# Patient Record
Sex: Male | Born: 1965 | Race: White | Hispanic: No | Marital: Married | Smoking: Never smoker
Health system: Southern US, Community
[De-identification: ages and names within clinical notes are randomized; demographics above are authoritative.]

## PROBLEM LIST (undated history)

## (undated) DIAGNOSIS — J40 Bronchitis, not specified as acute or chronic: Secondary | ICD-10-CM

## (undated) DIAGNOSIS — N200 Calculus of kidney: Secondary | ICD-10-CM

---

## 2012-04-18 ENCOUNTER — Emergency Department (HOSPITAL_COMMUNITY)
Admission: EM | Admit: 2012-04-18 | Discharge: 2012-04-19 | Disposition: A | Discharge status: Home / Self Care | Payer: 59 | Attending: Emergency Medicine | Admitting: Emergency Medicine

## 2012-04-18 ENCOUNTER — Encounter (HOSPITAL_COMMUNITY): Payer: Self-pay | Admitting: *Deleted

## 2012-04-18 DIAGNOSIS — Z79899 Other long term (current) drug therapy: Secondary | ICD-10-CM | POA: Insufficient documentation

## 2012-04-18 DIAGNOSIS — R110 Nausea: Secondary | ICD-10-CM | POA: Insufficient documentation

## 2012-04-18 DIAGNOSIS — N201 Calculus of ureter: Secondary | ICD-10-CM | POA: Insufficient documentation

## 2012-04-18 NOTE — ED Notes (Signed)
The pt has had lt flank pain since yesterday.  No nv no previous history

## 2012-04-19 ENCOUNTER — Emergency Department (HOSPITAL_COMMUNITY): Payer: 59

## 2012-04-19 LAB — CBC WITH DIFFERENTIAL/PLATELET
Basophils Absolute: 0 10*3/uL (ref 0.0–0.1)
Eosinophils Absolute: 0.4 10*3/uL (ref 0.0–0.7)
Eosinophils Relative: 4 % (ref 0–5)
Lymphs Abs: 3.9 10*3/uL (ref 0.7–4.0)
MCH: 30.3 pg (ref 26.0–34.0)
Neutrophils Relative %: 54 % (ref 43–77)
Platelets: 210 10*3/uL (ref 150–400)
RBC: 5.34 MIL/uL (ref 4.22–5.81)
RDW: 12.6 % (ref 11.5–15.5)
WBC: 11.3 10*3/uL — ABNORMAL HIGH (ref 4.0–10.5)

## 2012-04-19 LAB — COMPREHENSIVE METABOLIC PANEL
ALT: 21 U/L (ref 0–53)
AST: 20 U/L (ref 0–37)
Albumin: 4.1 g/dL (ref 3.5–5.2)
Alkaline Phosphatase: 86 U/L (ref 39–117)
Calcium: 9.6 mg/dL (ref 8.4–10.5)
GFR calc Af Amer: >90 mL/min (ref >90)
Glucose, Bld: 111 mg/dL — ABNORMAL HIGH (ref 70–99)
Potassium: 3.7 mEq/L (ref 3.5–5.1)
Sodium: 141 mEq/L (ref 135–145)
Total Protein: 7.5 g/dL (ref 6.0–8.3)

## 2012-04-19 LAB — URINALYSIS, ROUTINE W REFLEX MICROSCOPIC
Bilirubin Urine: NEGATIVE
Nitrite: NEGATIVE
Protein, ur: NEGATIVE mg/dL
Specific Gravity, Urine: 1.029 (ref 1.005–1.030)
Urobilinogen, UA: 0.2 mg/dL (ref 0.0–1.0)

## 2012-04-19 LAB — URINE MICROSCOPIC-ADD ON

## 2012-04-19 MED ORDER — PROMETHAZINE HCL 25 MG PO TABS: 25.0000 mg | ORAL_TABLET | Freq: Four times a day (QID) | ORAL | Status: DC | PRN

## 2012-04-19 MED ORDER — HYDROMORPHONE HCL PF 1 MG/ML IJ SOLN
1.0000 mg | Freq: Once | INTRAMUSCULAR | Status: AC
  Administered 2012-04-19: 1 mg via INTRAVENOUS
  Filled 2012-04-19: qty 1

## 2012-04-19 MED ORDER — HYDROCODONE-ACETAMINOPHEN 5-500 MG PO TABS: 1.0000 | ORAL_TABLET | Freq: Four times a day (QID) | ORAL | Status: DC | PRN

## 2012-04-19 MED ORDER — SODIUM CHLORIDE 0.9 % IV SOLN
Freq: Once | INTRAVENOUS | Status: AC
  Administered 2012-04-19: 02:00:00 via INTRAVENOUS

## 2012-04-19 MED ORDER — KETOROLAC TROMETHAMINE 30 MG/ML IJ SOLN
30.0000 mg | Freq: Once | INTRAMUSCULAR | Status: AC
  Administered 2012-04-19: 30 mg via INTRAVENOUS
  Filled 2012-04-19: qty 1

## 2012-04-19 MED ORDER — ONDANSETRON HCL 4 MG/2ML IJ SOLN
4.0000 mg | Freq: Once | INTRAMUSCULAR | Status: AC
  Administered 2012-04-19: 4 mg via INTRAVENOUS
  Filled 2012-04-19: qty 2

## 2012-04-19 MED ORDER — OXYCODONE-ACETAMINOPHEN 5-325 MG PO TABS
2.0000 | ORAL_TABLET | Freq: Once | ORAL | Status: DC
  Filled 2012-04-19: qty 2

## 2012-04-19 MED ORDER — NAPROXEN 500 MG PO TABS: 500.0000 mg | ORAL_TABLET | Freq: Two times a day (BID) | ORAL | Status: AC

## 2012-04-19 MED ORDER — ONDANSETRON 4 MG PO TBDP: 4.0000 mg | ORAL_TABLET | Freq: Three times a day (TID) | ORAL | Status: DC | PRN

## 2012-04-19 MED ORDER — TAMSULOSIN HCL 0.4 MG PO CAPS: 0.4000 mg | ORAL_CAPSULE | Freq: Two times a day (BID) | ORAL | Status: DC

## 2012-04-19 MED ORDER — PROMETHAZINE HCL 25 MG/ML IJ SOLN
25.0000 mg | Freq: Once | INTRAMUSCULAR | Status: AC
  Administered 2012-04-19: 25 mg via INTRAVENOUS
  Filled 2012-04-19: qty 1

## 2012-04-19 NOTE — ED Notes (Signed)
Pt and family at bedside report intermittent left flank pain that began on Friday - pt states tonight the pain awoke him from his sleep and has become unbearable - pt grimacing and pacing - unable to lay on the stretcher. Pt denies any dysuria, hematuria, or fever.

## 2012-04-19 NOTE — ED Provider Notes (Signed)
History     CSN: [id number]  Arrival date & time 04/18/12  2352   First MD Initiated Contact with Patient 04/19/12 0000      Chief Complaint  Patient presents with  . Flank Pain    (Consider location/radiation/quality/duration/timing/severity/associated sxs/prior treatment) HPI Comments: 46 year old male with no past medical history, no abdominal surgery. He presents with a complaint of left flank pain radiating to the left groin which started on Friday. The onset was acute, the course has been intermittent, he has discrete flares of the pain which feels like he is being hit in the flank. He has associated nausea when the pain hits. It became much more severe this evening. He denies any dysuria, he does admit to having more concentrated and dark appearing urine but attributes this to less by mouth fluid intake over the last 24 hours.  Patient is a 46 y.o. male presenting with flank pain. The history is provided by the patient and the spouse.  Flank Pain Associated symptoms include abdominal pain.    History reviewed. No pertinent past medical history.  History reviewed. No pertinent past surgical history.  No family history on file.  History  Substance Use Topics  . Smoking status: Never Smoker   . Smokeless tobacco: Not on file  . Alcohol Use: Yes      Review of Systems  Constitutional: Negative for fever and chills.  Gastrointestinal: Positive for nausea and abdominal pain. Negative for vomiting.  Genitourinary: Positive for flank pain. Negative for dysuria.    Allergies  Review of patient's allergies indicates no known allergies.  Home Medications   Current Outpatient Rx  Name  Route  Sig  Dispense  Refill  . HYDROCODONE-ACETAMINOPHEN 5-500 MG PO TABS   Oral   Take 1-2 tablets by mouth every 6 (six) hours as needed for pain.   15 tablet   0   . NAPROXEN 500 MG PO TABS   Oral   Take 1 tablet (500 mg total) by mouth 2 (two) times daily with a meal.   30  tablet   0   . ONDANSETRON 4 MG PO TBDP   Oral   Take 1 tablet (4 mg total) by mouth every 8 (eight) hours as needed for nausea.   10 tablet   0   . PROMETHAZINE HCL 25 MG PO TABS   Oral   Take 1 tablet (25 mg total) by mouth every 6 (six) hours as needed for nausea.   12 tablet   0   . TAMSULOSIN HCL 0.4 MG PO CAPS   Oral   Take 1 capsule (0.4 mg total) by mouth 2 (two) times daily.   10 capsule   0     BP 168/104  Pulse 91  Resp 20  SpO2 98%  Physical Exam  Nursing note and vitals reviewed. Constitutional:       Uncomfortable appearing  HENT:       Normocephalic, atraumatic  Eyes:       No scleral icterus, conjunctiva are clear, pupils are equal  Cardiovascular:       Regular rate and rhythm, no tachycardia, no murmurs  Pulmonary/Chest:       Lungs clear, no increased work of breathing, no wheezes rales or rhonchi  Abdominal:       Soft and nontender abdomen, CVA tenderness present on the left  Musculoskeletal:       No peripheral edema  Neurological:       Alert, follows  commands, gait is normal, speech is clear  Skin:       Warm and dry skin, no rash    ED Course  Procedures (including critical care time)  Labs Reviewed  URINALYSIS, ROUTINE W REFLEX MICROSCOPIC - Abnormal; Notable for the following:    APPearance CLOUDY (*)     Hgb urine dipstick LARGE (*)     All other components within normal limits  CBC WITH DIFFERENTIAL - Abnormal; Notable for the following:    WBC 11.3 (*)     All other components within normal limits  COMPREHENSIVE METABOLIC PANEL - Abnormal; Notable for the following:    Glucose, Bld 111 (*)     All other components within normal limits  URINE MICROSCOPIC-ADD ON - Abnormal; Notable for the following:    Bacteria, UA FEW (*)     All other components within normal limits   Ct Abdomen Pelvis Wo Contrast  04/19/2012  *RADIOLOGY REPORT*  Clinical Data: Severe left flank pain with nausea since yesterday.  CT ABDOMEN AND PELVIS  WITHOUT CONTRAST  Technique:  Multidetector CT imaging of the abdomen and pelvis was performed following the standard protocol without intravenous contrast.  Comparison: None.  Findings: Approximate 3 mm calculus at the left UVJ with marked thickening of the wall of the distal ureter surrounding the calculus.  This causes mild left hydronephrosis, moderate left renal edema, and mild left perinephric edema.  No urinary tract calculi elsewhere on either side.  No right urinary tract obstruction.  Within the limits of the unenhanced technique, no focal parenchymal abnormality involving either kidney.  Sub-centimeter simple cysts in the anterior and posterior segments of the right lobe of liver; within the limits of the unenhanced technique, with significant abnormality involving the liver. Normal unenhanced appearance of the spleen, pancreas, and adrenal glands. Gallbladder unremarkable by CT.  No biliary ductal dilation.  No visible aorto-iliofemoral atherosclerosis.  No significant lymphadenopathy.  Stomach filled with food and normal in appearance.  Normal- appearing small bowel and colon.  Normal appendix in the right mid and upper pelvis.  No ascites.  Urinary bladder decompressed and unremarkable.  Normal prostate gland and seminal vesicles for age.  Bone window images unremarkable.  Visualized lung bases clear.  IMPRESSION:  1.  Obstructing approximate 3 mm calculus at the left UVJ. 2.  No acute or significant abnormalities otherwise.   Original Report Authenticated By: Hulan Saas, M.D.      1. Ureterolithiasis       MDM  Exam consistent with a kidney stone, patient also has hypertension, will recheck after pain medications given, CT scan pending, check renal function, urinalysis to rule out infection.  Patient given Dilaudid, Toradol and Percocet prior to discharge with improvement  CT scan reviewed and shows a obstructing distal 3 mm ureteral stone at the ureterovesical junction. The patient  has had improvement with his medications, blood pressure recheck shows 145/95.  1:30 AM, patient having persistent pain, it has improved but now is having some nausea and vomiting as well. Will start second fluid bolus, recurrent repeat dose of Dilaudid.  Patient has done well, amenable to discharge  Discharge Prescriptions include:  Naprosyn Hydrocodone Flomax Phenergan Zofran     Vida Roller, MD 04/19/12 [phone]

## 2012-04-19 NOTE — ED Notes (Signed)
Patient transported to CT 

## 2012-04-19 NOTE — ED Notes (Signed)
Rx given x5 D/c instructions reviewed w/ pt and family - pt and family deny any further questions or concerns at present.

## 2012-07-24 ENCOUNTER — Emergency Department (HOSPITAL_COMMUNITY): Payer: 59

## 2012-07-24 ENCOUNTER — Observation Stay (HOSPITAL_COMMUNITY)
Admission: EM | Admit: 2012-07-24 | Discharge: 2012-07-25 | Disposition: A | Discharge status: Home / Self Care | Payer: 59 | Attending: Emergency Medicine | Admitting: Emergency Medicine

## 2012-07-24 ENCOUNTER — Other Ambulatory Visit: Payer: Self-pay

## 2012-07-24 ENCOUNTER — Encounter (HOSPITAL_COMMUNITY): Payer: Self-pay | Admitting: Emergency Medicine

## 2012-07-24 DIAGNOSIS — Z792 Long term (current) use of antibiotics: Secondary | ICD-10-CM | POA: Insufficient documentation

## 2012-07-24 DIAGNOSIS — R0602 Shortness of breath: Secondary | ICD-10-CM | POA: Insufficient documentation

## 2012-07-24 DIAGNOSIS — R079 Chest pain, unspecified: Principal | ICD-10-CM | POA: Insufficient documentation

## 2012-07-24 DIAGNOSIS — J40 Bronchitis, not specified as acute or chronic: Secondary | ICD-10-CM | POA: Insufficient documentation

## 2012-07-24 DIAGNOSIS — M79609 Pain in unspecified limb: Secondary | ICD-10-CM | POA: Insufficient documentation

## 2012-07-24 HISTORY — DX: Calculus of kidney: N20.0

## 2012-07-24 HISTORY — DX: Bronchitis, not specified as acute or chronic: J40

## 2012-07-24 LAB — CBC WITH DIFFERENTIAL/PLATELET
Eosinophils Absolute: 0.3 10*3/uL (ref 0.0–0.7)
Hemoglobin: 16.1 g/dL (ref 13.0–17.0)
Lymphocytes Relative: 38 % (ref 12–46)
Lymphs Abs: 2.8 10*3/uL (ref 0.7–4.0)
MCH: 31 pg (ref 26.0–34.0)
Monocytes Relative: 8 % (ref 3–12)
Neutro Abs: 3.8 10*3/uL (ref 1.7–7.7)
Neutrophils Relative %: 51 % (ref 43–77)
Platelets: 210 10*3/uL (ref 150–400)
RBC: 5.2 MIL/uL (ref 4.22–5.81)
WBC: 7.4 10*3/uL (ref 4.0–10.5)

## 2012-07-24 LAB — BASIC METABOLIC PANEL
BUN: 18 mg/dL (ref 6–23)
Chloride: 104 mEq/L (ref 96–112)
GFR calc Af Amer: >90 mL/min (ref >90)
GFR calc non Af Amer: >90 mL/min (ref >90)
Glucose, Bld: 93 mg/dL (ref 70–99)
Potassium: 3.8 mEq/L (ref 3.5–5.1)
Sodium: 141 mEq/L (ref 135–145)

## 2012-07-24 LAB — POCT I-STAT TROPONIN I

## 2012-07-24 MED ORDER — ASPIRIN 81 MG PO CHEW
324.0000 mg | CHEWABLE_TABLET | Freq: Once | ORAL | Status: AC
  Administered 2012-07-24: 324 mg via ORAL

## 2012-07-24 MED ORDER — ASPIRIN 81 MG PO CHEW
CHEWABLE_TABLET | ORAL | Status: AC
  Administered 2012-07-24: 324 mg via ORAL
  Filled 2012-07-24: qty 4

## 2012-07-24 NOTE — ED Notes (Signed)
Pt c/o midsternal cp radiating to left arm and left side of neck x 5 days.

## 2012-07-24 NOTE — ED Provider Notes (Signed)
History     CSN: [id number]  Arrival date & time 07/24/12  1516   None     Chief Complaint  Patient presents with  . Chest Pain    (Consider location/radiation/quality/duration/timing/severity/associated sxs/prior treatment) HPI History provided by pt.   Pt has had constant discomfort at LSB between ~9 or 10am until the end of his work day, for the past 5 days.  Resolves w/ sleep.  Associated w/ intermittent SOB as well as sharp pain on inner aspect of left upper arm.  Denies recent fever/cough, but his PCP started him on an antibiotic for bronchitis yesterday.  He also prescribed aleve for treatment of possible costochondritis.  Advised him to go to ED for persistent sx.  Today, he had what he believed to be an adrenaline rush while playing a video game, the pain returned, was worse than prior episodes, and was associated w/ SOB and LUE weakness.  Currently asymptomatic.  Denies trauma.  No RF for PE and denies LE pain/edema.  Does not smoke cigarettes and no FH early MI.  Past Medical History  Diagnosis Date  . Kidney stone     History reviewed. No pertinent past surgical history.  No family history on file.  History  Substance Use Topics  . Smoking status: Never Smoker   . Smokeless tobacco: Not on file  . Alcohol Use: No      Review of Systems  All other systems reviewed and are negative.    Allergies  Review of patient's allergies indicates no known allergies.  Home Medications   Current Outpatient Rx  Name  Route  Sig  Dispense  Refill  . levofloxacin (LEVAQUIN) 750 MG tablet   Oral   Take 750 mg by mouth every evening. Take for 7 days. Started 07/23/12         . Multiple Vitamin (MULTIVITAMIN WITH MINERALS) TABS   Oral   Take 1 tablet by mouth daily.         . naproxen (NAPROSYN) 500 MG tablet   Oral   Take 1 tablet (500 mg total) by mouth 2 (two) times daily with a meal.   30 tablet   0     BP 113/76  Pulse 65  Temp(Src) 97.7 F (36.5 C)  (Oral)  Resp 13  SpO2 97%  Physical Exam  Nursing note and vitals reviewed. Constitutional: He is oriented to person, place, and time. He appears well-developed and well-nourished. No distress.  HENT:  Head: Normocephalic and atraumatic.  Eyes:  Normal appearance  Neck: Normal range of motion.  Cardiovascular: Normal rate, regular rhythm and intact distal pulses.   Pulmonary/Chest: Effort normal and breath sounds normal. No respiratory distress. He exhibits no tenderness.  No pleuritic pain reported  Abdominal: Soft. Bowel sounds are normal. He exhibits no distension. There is no tenderness. There is no guarding.  Musculoskeletal: Normal range of motion.  No peripheral edema or calf tenderness  Neurological: He is alert and oriented to person, place, and time.  Skin: Skin is warm and dry. No rash noted.  Psychiatric: He has a normal mood and affect. His behavior is normal.    ED Course  Procedures (including critical care time)   Date: 07/24/2012  Rate: 70  Rhythm: normal sinus rhythm  QRS Axis: right  Intervals: normal  ST/T Wave abnormalities: normal  Conduction Disutrbances:incomplete RBBB  Narrative Interpretation:   Old EKG Reviewed: unchanged   Labs Reviewed  CBC WITH DIFFERENTIAL  BASIC METABOLIC PANEL  POCT I-STAT TROPONIN I  POCT I-STAT TROPONIN I   Dg Chest 2 View  07/24/2012  *RADIOLOGY REPORT*  Clinical Data: Chest pain.  CHEST - 2 VIEW  Comparison: None.  Findings: Cardiomediastinal silhouette appears normal.  No acute pulmonary disease is noted.  Bony thorax is intact.  IMPRESSION: No acute cardiopulmonary abnormality seen.   Original Report Authenticated By: Lupita Raider.,  M.D.      No diagnosis found.    MDM  47yo healthy M presents w/ CP.  Low risk ACS and both typical and atypical features of pain.  EKG non-ischemic, troponin neg, labs otherwise unremarkable and CXR neg.  Pt to be placed on CP protocol.    VSS.  Dr. Dierdre Highman aware of patient.   6:38 AM         Otilio Miu, PA-C 07/25/12 [phone]

## 2012-07-25 ENCOUNTER — Observation Stay (HOSPITAL_COMMUNITY): Payer: 59

## 2012-07-25 ENCOUNTER — Encounter (HOSPITAL_COMMUNITY): Payer: Self-pay | Admitting: *Deleted

## 2012-07-25 LAB — POCT I-STAT TROPONIN I: Troponin i, poc: 0 ng/mL (ref 0.00–0.08)

## 2012-07-25 MED ORDER — IBUPROFEN 600 MG PO TABS: 600.0000 mg | ORAL_TABLET | Freq: Four times a day (QID) | ORAL | Status: AC | PRN

## 2012-07-25 MED ORDER — METOPROLOL TARTRATE 25 MG PO TABS
50.0000 mg | ORAL_TABLET | Freq: Once | ORAL | Status: AC
Start: 2012-07-25 — End: 2012-07-25
  Administered 2012-07-25: 50 mg via ORAL
  Filled 2012-07-25: qty 2

## 2012-07-25 MED ORDER — METOPROLOL TARTRATE 1 MG/ML IV SOLN
5.0000 mg | Freq: Once | INTRAVENOUS | Status: AC
  Administered 2012-07-25: 5 mg via INTRAVENOUS
  Filled 2012-07-25: qty 10

## 2012-07-25 MED ORDER — IOHEXOL 350 MG/ML SOLN
80.0000 mL | Freq: Once | INTRAVENOUS | Status: AC | PRN
  Administered 2012-07-25: 75 mL via INTRAVENOUS

## 2012-07-25 MED ORDER — NITROGLYCERIN 0.4 MG SL SUBL
0.4000 mg | SUBLINGUAL_TABLET | Freq: Once | SUBLINGUAL | Status: AC
  Administered 2012-07-25: 0.4 mg via SUBLINGUAL
  Filled 2012-07-25: qty 25

## 2012-07-25 MED ORDER — METOPROLOL TARTRATE 25 MG PO TABS
50.0000 mg | ORAL_TABLET | Freq: Once | ORAL | Status: AC
  Administered 2012-07-25: 50 mg via ORAL
  Filled 2012-07-25: qty 2

## 2012-07-25 NOTE — ED Provider Notes (Signed)
Medical screening examination/treatment/procedure(s) were conducted as a shared visit with non-physician practitioner(s) and myself.  I personally evaluated the patient during the encounter  History: patient presents do to 5-7 days of atypical chest pain that seems to be worse during the day while he is at work with intermittent shortness of breath and pain that radiates to the left arm. He states that it occurs whether he gets excited from something good or bad. Currently he is not experiencing any chest pain. He has never had chest pain in the past. Patient saw his PCP yesterday and was placed on Levaquin and NSAIDs for possible bronchitis. However with a worsening episode today his Dr. Ivan Anchors that he go to the emergency room for further evaluation.  Physical exam: Gen.: No acute distress HEENT: Pupils are equal round reactive, mouth is moist throat is clear Cardiovascular: Regular rate and rhythm, no murmurs rubs and gallops. Mild chest wall tenderness to the left side of the sternum Respiratory: Clear to auscultation bilaterally. No wheezes rales or rhonchi Abdomen: No tenderness to palpation Extremities: No edema and 2+ pulses bilaterally  Plan: Patient who is young and it TIMI 1 for recurrent episodes with a history of atypical chest pain. Given the worsening progressive nature of his symptoms we'll place on the chest pain protocol for a CTA in the morning. Patient is currently chest pain-free. He was given an aspirin  Gwyneth Sprout, MD 07/25/12 [phone]

## 2012-07-26 NOTE — Progress Notes (Signed)
UR completed 

## 2014-06-19 IMAGING — CT CT ABD-PELV W/O CM
2 of 4 series · 17 of 46 positions shown, 19 images · non-contrast
Comparison: None.

CLINICAL DATA: Severe left flank pain with nausea since yesterday.

CT ABDOMEN AND PELVIS WITHOUT CONTRAST
TECHNIQUE: Multidetector CT imaging of the abdomen and pelvis was
performed following the standard protocol without intravenous
contrast.

[Series 2: stone >200 lbs 5.0 b31f st · axial · 0.76mm/px · z∈[-498,-72]mm · 14 of 93 slices shown, 16 images]
[im 4/93  soft-tissue]
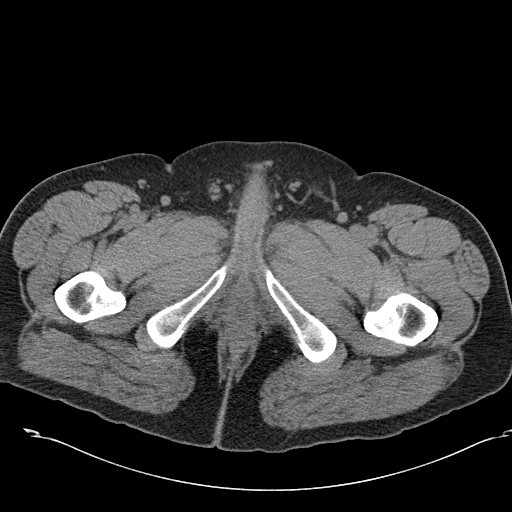
[im 4/93  bone]
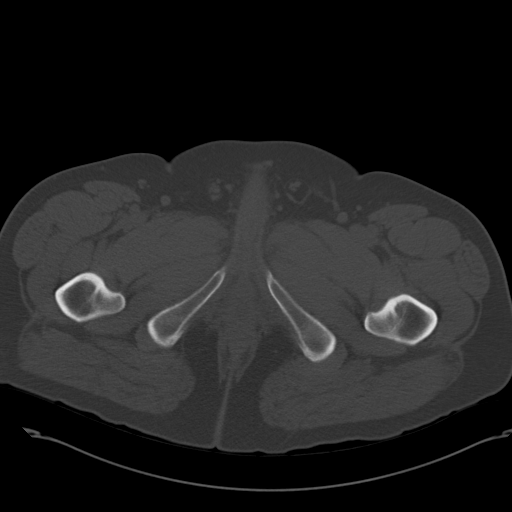
[im 11/93  soft-tissue]
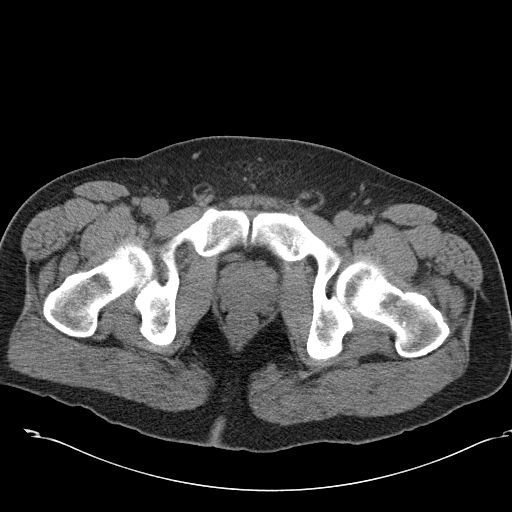
[im 18/93  soft-tissue]
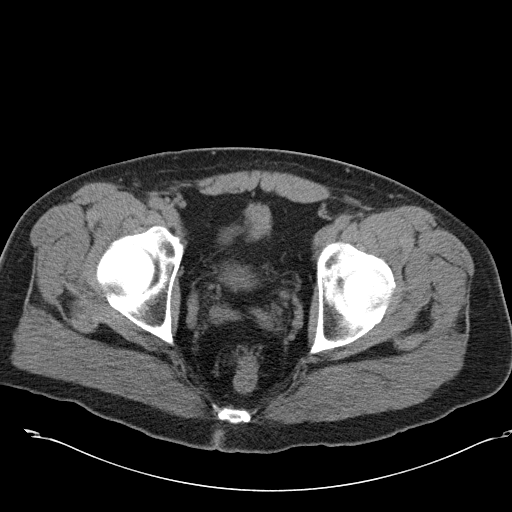
[im 25/93  soft-tissue]
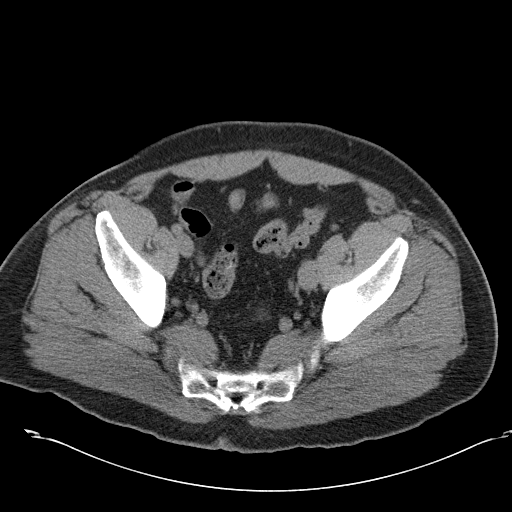
[im 32/93  soft-tissue]
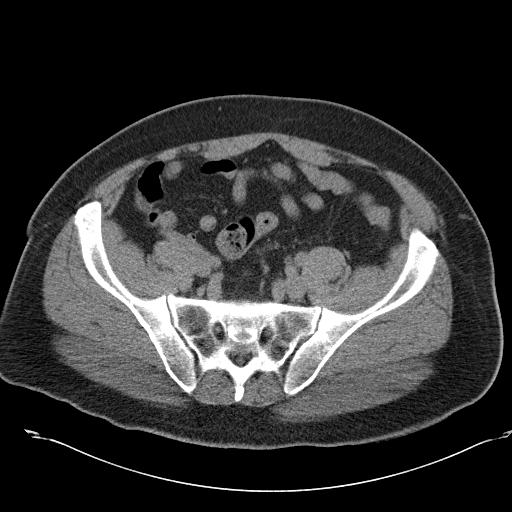
[im 36/93  soft-tissue]
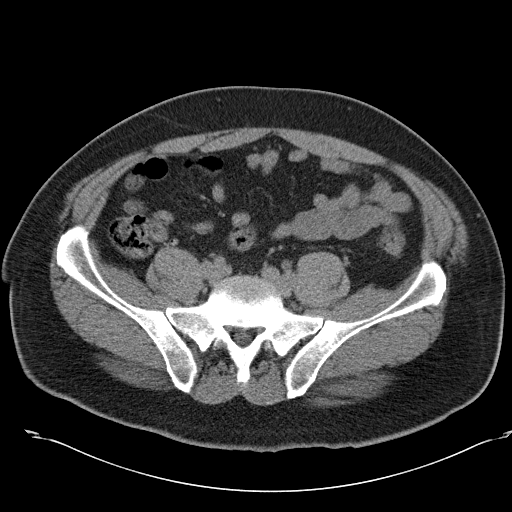
[im 43/93  soft-tissue]
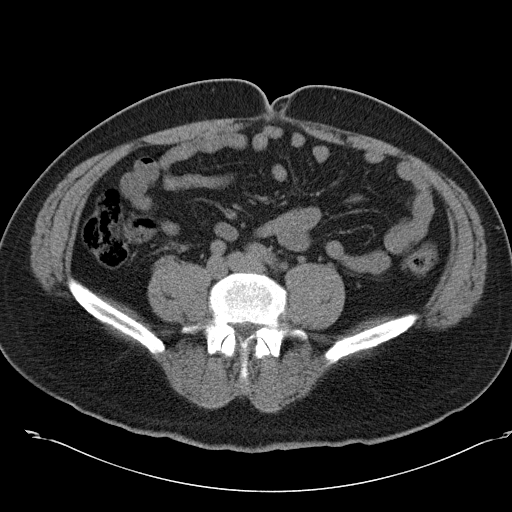
[im 50/93  soft-tissue]
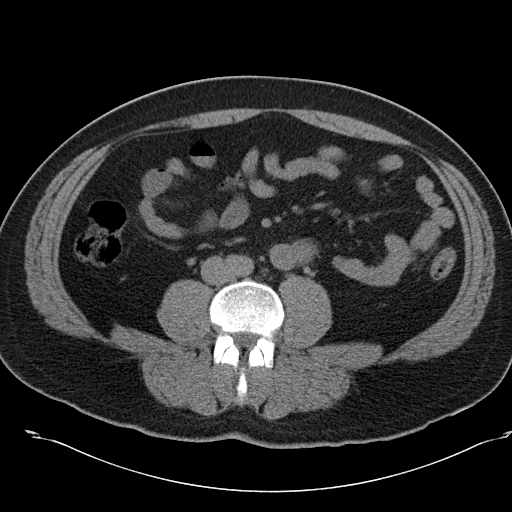
[im 57/93  soft-tissue]
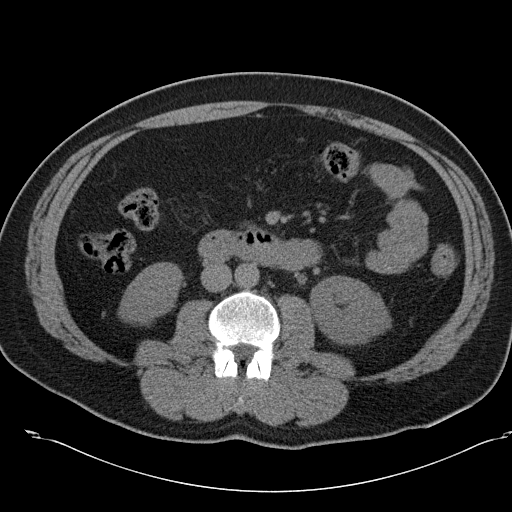
[im 57/93  bone]
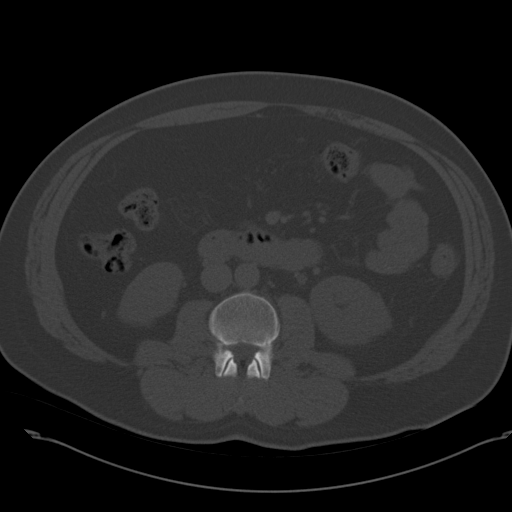
[im 61/93  soft-tissue]
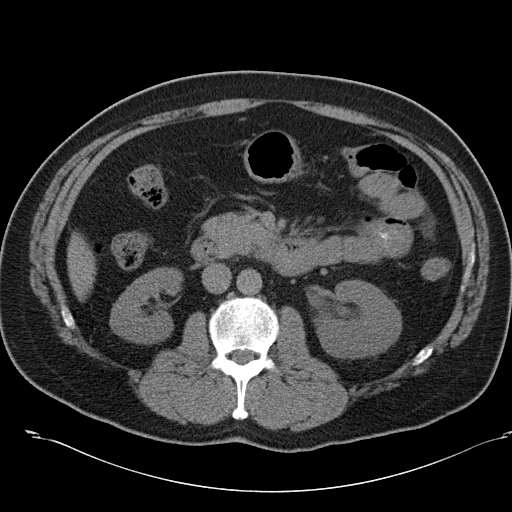
[im 68/93  soft-tissue]
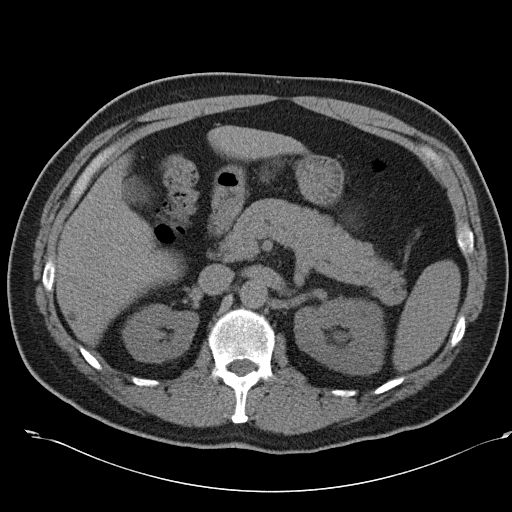
[im 75/93  soft-tissue]
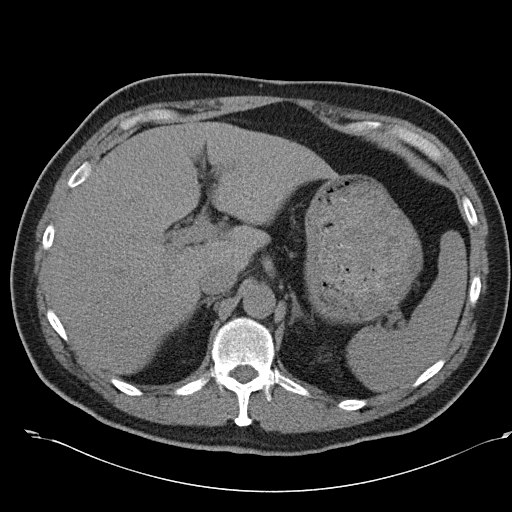
[im 82/93  soft-tissue]
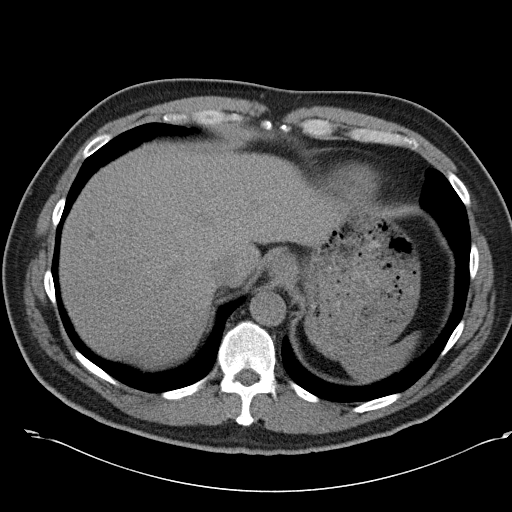
[im 89/93  soft-tissue]
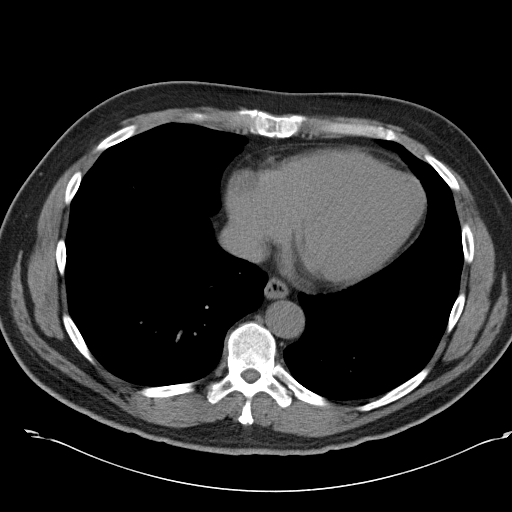

[Series 602: cor · coronal · 0.89mm/px · 3 of 82 slices shown]
[im 28/82  soft-tissue]
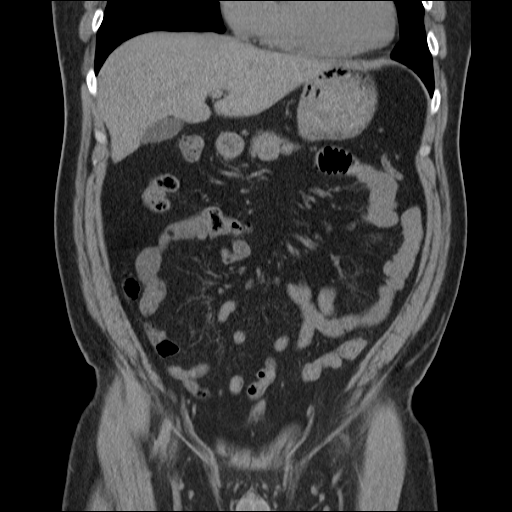
[im 37/82  soft-tissue]
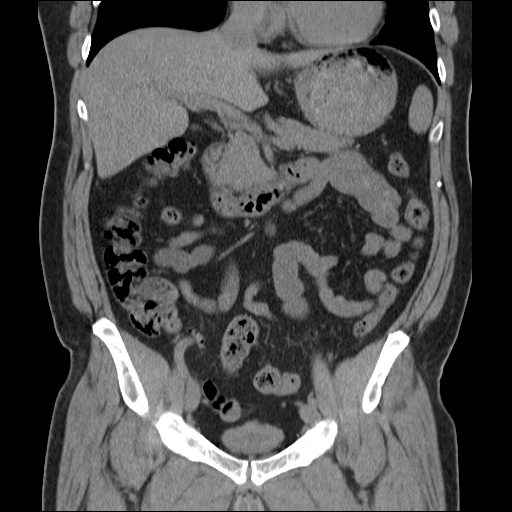
[im 46/82  soft-tissue]
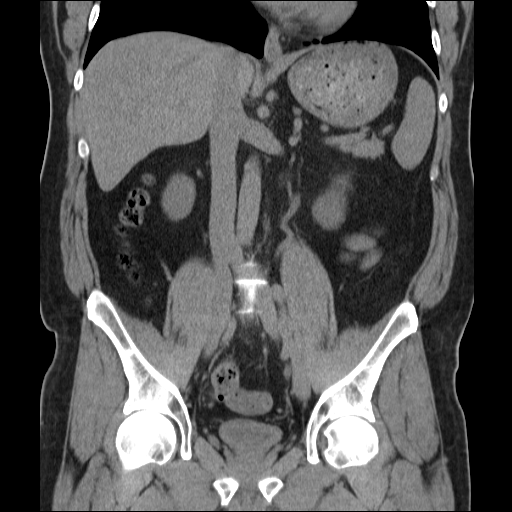

[17 of 46 positions shown; findings below may reference images not displayed]

FINDINGS: Approximate 3 mm calculus at the left UVJ with marked
thickening of the wall of the distal ureter surrounding the
calculus.  This causes mild left hydronephrosis, moderate left
renal edema, and mild left perinephric edema.  No urinary tract
calculi elsewhere on either side.  No right urinary tract
obstruction.  Within the limits of the unenhanced technique, no
focal parenchymal abnormality involving either kidney.

Sub-centimeter simple cysts in the anterior and posterior segments
of the right lobe of liver; within the limits of the unenhanced
technique, with significant abnormality involving the liver. Normal
unenhanced appearance of the spleen, pancreas, and adrenal glands.
Gallbladder unremarkable by CT.  No biliary ductal dilation.  No
visible aorto-iliofemoral atherosclerosis.  No significant
lymphadenopathy.

Stomach filled with food and normal in appearance.  Normal-
appearing small bowel and colon.  Normal appendix in the right mid
and upper pelvis.  No ascites.

Urinary bladder decompressed and unremarkable.  Normal prostate
gland and seminal vesicles for age.

Bone window images unremarkable.  Visualized lung bases clear.
IMPRESSION: 1.  Obstructing approximate 3 mm calculus at the left UVJ.
2.  No acute or significant abnormalities otherwise.

## 2018-03-22 ENCOUNTER — Ambulatory Visit: Payer: Self-pay | Admitting: Surgery

## 2018-03-22 NOTE — H&P (Signed)
History of Present Illness Andrew Krause. Marcellous Snarski MD; 03/22/2018 1:37 PM) The patient is a 52 year old male who presents with a complaint of Mass. Referred by Dr. Salli Real for tender scalp masses  This is a 52 year old male who presents with over 10 year history of an enlarging mass in the left parietal region of the scalp. Over the last 6 years he has developed another smaller mass in the left posterior occipital region. Both of these masses have slowly enlarged. They have become fairly tender. The patient works in [employer] and often wears a hard hat. The heart had sits right over these areas and causes some irritation. There is never been any infection in this area. No drainage from either mass. He would like to have these removed to prevent the enlargement and tenderness.   Past Surgical History Doristine Devoid, CMA; 03/22/2018 9:23 AM) Tonsillectomy  Diagnostic Studies History Doristine Devoid, CMA; 03/22/2018 9:23 AM) Colonoscopy 1-5 years ago  Allergies Doristine Devoid, CMA; 03/22/2018 9:24 AM) No Known Drug Allergies [03/22/2018]:  Medication History Doristine Devoid, CMA; 03/22/2018 9:24 AM) No Current Medications Medications Reconciled  Social History Doristine Devoid, CMA; 03/22/2018 9:23 AM) Alcohol use Occasional alcohol use. No caffeine use No drug use Tobacco use Never smoker.  Family History Doristine Devoid, CMA; 03/22/2018 9:23 AM) Arthritis Mother. Cerebrovascular Accident Mother. Hypertension Father. Thyroid problems Daughter.  Other Problems Doristine Devoid, CMA; 03/22/2018 9:23 AM) Kidney Larina Bras     Review of Systems (Doristine Devoid CMA; 03/22/2018 9:23 AM) General Not Present- Appetite Loss, Chills, Fatigue, Fever, Night Sweats, Weight Gain and Weight Loss. HEENT Present- Seasonal Allergies. Not Present- Earache, Hearing Loss, Hoarseness, Nose Bleed, Oral Ulcers, Ringing in the Ears, Sinus Pain, Sore Throat, Visual Disturbances, Wears glasses/contact lenses  and Yellow Eyes. Respiratory Present- Snoring. Not Present- Bloody sputum, Chronic Cough, Difficulty Breathing and Wheezing. Breast Not Present- Breast Mass, Breast Pain, Nipple Discharge and Skin Changes. Cardiovascular Not Present- Chest Pain, Difficulty Breathing Lying Down, Leg Cramps, Palpitations, Rapid Heart Rate, Shortness of Breath and Swelling of Extremities. Gastrointestinal Not Present- Abdominal Pain, Bloating, Bloody Stool, Change in Bowel Habits, Chronic diarrhea, Constipation, Difficulty Swallowing, Excessive gas, Gets full quickly at meals, Hemorrhoids, Indigestion, Nausea, Rectal Pain and Vomiting. Male Genitourinary Not Present- Blood in Urine, Change in Urinary Stream, Frequency, Impotence, Nocturia, Painful Urination, Urgency and Urine Leakage. Musculoskeletal Not Present- Back Pain, Joint Pain, Joint Stiffness, Muscle Pain, Muscle Weakness and Swelling of Extremities. Neurological Not Present- Decreased Memory, Fainting, Headaches, Numbness, Seizures, Tingling, Tremor, Trouble walking and Weakness. Psychiatric Not Present- Anxiety, Bipolar, Change in Sleep Pattern, Depression, Fearful and Frequent crying. Endocrine Not Present- Cold Intolerance, Excessive Hunger, Hair Changes, Heat Intolerance, Hot flashes and New Diabetes. Hematology Not Present- Blood Thinners, Easy Bruising, Excessive bleeding, Gland problems, HIV and Persistent Infections.  Vitals (Chemira Jones CMA; 03/22/2018 9:24 AM) 03/22/2018 9:23 AM Weight: 247.6 lb Height: 71in Body Surface Area: 2.31 m Body Mass Index: 34.53 kg/m  Temp.: 98.50F(Oral)  Pulse: 93 (Regular)  BP: 140/80 (Sitting, Left Arm, Standard)      Physical Exam Molli Hazard K. Orestes Geiman MD; 03/22/2018 1:38 PM)  The physical exam findings are as follows: Note:WDWN in NAD Left parietal scalp - protruding 2.5 cm subcutaneous mass; no skin changes; no drainage Left occipital region - palpable 1.5 cm subcutaneous mass; no skin changes;  no drainage HENT: Oral mucosa moist; good dentition Neck: No masses palpated, no thyromegaly Lungs: CTA bilaterally; normal respiratory effort CV: Regular rate and rhythm; no murmurs; extremities  well-perfused with no edema Abd: +bowel sounds, soft, non-tender, no palpable organomegaly; no palpable hernias Skin: Warm, dry; no sign of jaundice Psychiatric - alert and oriented x 4; calm mood and affect    Assessment & Plan Molli Hazard K. 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 The surgical procedure has been discussed with the patient. Potential risks, benefits, alternative treatments, and expected outcomes have been explained. All of the patient's questions at this time have been answered. The likelihood of reaching the patient's treatment goal is good. The patient understand the proposed surgical procedure and wishes to proceed.  Andrew Krause. Corliss Skains, MD, [facility]  General/ Trauma Surgery Beeper [phone]  03/22/2018 1:38 PM
